# Patient Record
Sex: Male | Born: 1995 | Race: Black or African American | Hispanic: No | Marital: Single | Smoking: Current some day smoker
Health system: Southern US, Community
[De-identification: ages and names within clinical notes are randomized; demographics above are authoritative.]

## PROBLEM LIST (undated history)

## (undated) ENCOUNTER — Ambulatory Visit (HOSPITAL_COMMUNITY): Admission: EM | Payer: Medicaid Other | Source: Home / Self Care

---
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
## 2018-10-22 ENCOUNTER — Ambulatory Visit (HOSPITAL_COMMUNITY)
Admission: EM | Admit: 2018-10-22 | Discharge: 2018-10-22 | Disposition: A | Discharge status: Home / Self Care | Payer: Medicaid Other | Attending: Family Medicine | Admitting: Family Medicine

## 2018-10-22 ENCOUNTER — Other Ambulatory Visit: Payer: Self-pay

## 2018-10-22 ENCOUNTER — Encounter (HOSPITAL_COMMUNITY): Payer: Self-pay | Admitting: *Deleted

## 2018-10-22 DIAGNOSIS — R369 Urethral discharge, unspecified: Secondary | ICD-10-CM | POA: Diagnosis not present

## 2018-10-22 DIAGNOSIS — R8281 Pyuria: Secondary | ICD-10-CM | POA: Diagnosis present

## 2018-10-22 DIAGNOSIS — Z113 Encounter for screening for infections with a predominantly sexual mode of transmission: Secondary | ICD-10-CM

## 2018-10-22 DIAGNOSIS — Z202 Contact with and (suspected) exposure to infections with a predominantly sexual mode of transmission: Secondary | ICD-10-CM | POA: Diagnosis not present

## 2018-10-22 DIAGNOSIS — R300 Dysuria: Secondary | ICD-10-CM

## 2018-10-22 DIAGNOSIS — F17210 Nicotine dependence, cigarettes, uncomplicated: Secondary | ICD-10-CM

## 2018-10-22 DIAGNOSIS — N342 Other urethritis: Secondary | ICD-10-CM | POA: Diagnosis present

## 2018-10-22 LAB — POCT I-STAT, CHEM 8
BUN: 10 mg/dL (ref 6–20)
CALCIUM ION: 1.16 mmol/L (ref 1.15–1.40)
Chloride: 103 mmol/L (ref 98–111)
Creatinine, Ser: 1 mg/dL (ref 0.61–1.24)
Glucose, Bld: 91 mg/dL (ref 70–99)
HCT: 50 % (ref 39.0–52.0)
Hemoglobin: 17 g/dL (ref 13.0–17.0)
Potassium: 4 mmol/L (ref 3.5–5.1)
Sodium: 139 mmol/L (ref 135–145)
TCO2: 29 mmol/L (ref 22–32)

## 2018-10-22 LAB — CBC WITH DIFFERENTIAL/PLATELET
Abs Immature Granulocytes: 0.02 10*3/uL (ref 0.00–0.07)
Basophils Absolute: 0 10*3/uL (ref 0.0–0.1)
Basophils Relative: 0 %
Eosinophils Absolute: 0 10*3/uL (ref 0.0–0.5)
Eosinophils Relative: 0 %
HCT: 47 % (ref 39.0–52.0)
Hemoglobin: 15.6 g/dL (ref 13.0–17.0)
Immature Granulocytes: 0 %
Lymphocytes Relative: 30 %
Lymphs Abs: 2.4 10*3/uL (ref 0.7–4.0)
MCH: 30.2 pg (ref 26.0–34.0)
MCHC: 33.2 g/dL (ref 30.0–36.0)
MCV: 90.9 fL (ref 80.0–100.0)
Monocytes Absolute: 0.6 10*3/uL (ref 0.1–1.0)
Monocytes Relative: 7 %
Neutro Abs: 4.9 10*3/uL (ref 1.7–7.7)
Neutrophils Relative %: 63 %
Platelets: 202 10*3/uL (ref 150–400)
RBC: 5.17 MIL/uL (ref 4.22–5.81)
RDW: 11.5 % (ref 11.5–15.5)
WBC: 8 10*3/uL (ref 4.0–10.5)
nRBC: 0 % (ref 0.0–0.2)

## 2018-10-22 LAB — RAPID HIV SCREEN (HIV 1/2 AB+AG)
HIV 1/2 Antibodies: NONREACTIVE
HIV-1 P24 Antigen - HIV24: NONREACTIVE

## 2018-10-22 LAB — POCT URINALYSIS DIP (DEVICE)
Bilirubin Urine: NEGATIVE
Glucose, UA: NEGATIVE mg/dL
HGB URINE DIPSTICK: NEGATIVE
Ketones, ur: NEGATIVE mg/dL
Nitrite: NEGATIVE
PH: 7 (ref 5.0–8.0)
Protein, ur: NEGATIVE mg/dL
Specific Gravity, Urine: 1.02 (ref 1.005–1.030)
Urobilinogen, UA: 1 mg/dL (ref 0.0–1.0)

## 2018-10-22 MED ORDER — AZITHROMYCIN 250 MG PO TABS
ORAL_TABLET | ORAL | Status: AC
  Filled 2018-10-22: qty 4

## 2018-10-22 MED ORDER — AZITHROMYCIN 250 MG PO TABS
1000.0000 mg | ORAL_TABLET | Freq: Once | ORAL | Status: AC
  Administered 2018-10-22: 1000 mg via ORAL

## 2018-10-22 MED ORDER — CIPROFLOXACIN HCL 500 MG PO TABS: 500.0000 mg | ORAL_TABLET | Freq: Two times a day (BID) | ORAL | 0 refills | Status: DC

## 2018-10-22 NOTE — ED Provider Notes (Signed)
[facility]    CSN: [id number] Arrival date & time: 10/22/18  1248     History   Chief Complaint Chief Complaint  Patient presents with  . Exposure to STD    HPI Camran Raman is a 23 y.o. male.   This is the initial visit for this 23 year old man seeking evaluation for STD.  He has had a clear discharge and slight burning for 3 days.  He has a new partner.  Patient denies any skin lesions, fever, joint pain.     History reviewed. No pertinent past medical history.  There are no active problems to display for this patient.   History reviewed. No pertinent surgical history.     Home Medications    Prior to Admission medications   Medication Sig Start Date End Date Taking? Authorizing Provider  cyclopentolate (CYCLODRYL,CYCLOGYL) 1 % ophthalmic solution Place 1 drop into the right eye 3 (three) times daily. 06/06/14   Elwin Mocha, MD  ibuprofen (ADVIL,MOTRIN) 600 MG tablet Take 1 tablet (600 mg total) by mouth every 6 (six) hours as needed. 06/06/14   Elwin Mocha, MD  prednisoLONE acetate (PRED FORTE) 1 % ophthalmic suspension Place 1 drop into the right eye 4 (four) times daily. 06/06/14   Elwin Mocha, MD    Family History No family history on file.  Social History Social History   Tobacco Use  . Smoking status: Current Every Day Smoker  . Smokeless tobacco: Never Used  Substance Use Topics  . Alcohol use: Yes  . Drug use: Not Currently     Allergies   Patient has no known allergies.   Review of Systems Review of Systems   Physical Exam Triage Vital Signs ED Triage Vitals [10/22/18 1332]  Enc Vitals Group     BP 123/80     Pulse Rate (!) 52     Resp 18     Temp 97.6 F (36.4 C)     Temp Source Oral     SpO2 99 %     Weight      Height      Head Circumference      Peak Flow      Pain Score      Pain Loc      Pain Edu?      Excl. in GC?    No data found.  Updated Vital Signs BP 123/80 (BP Location: Right Arm)    Pulse (!) 52   Temp 97.6 F (36.4 C) (Oral)   Resp 18   SpO2 99%   Physical Exam Vitals signs and nursing note reviewed.  Constitutional:      Appearance: Normal appearance.  HENT:     Head: Normocephalic.     Mouth/Throat:     Mouth: Mucous membranes are moist.  Eyes:     Conjunctiva/sclera: Conjunctivae normal.  Pulmonary:     Effort: Pulmonary effort is normal.  Skin:    General: Skin is warm and dry.  Neurological:     General: No focal deficit present.     Mental Status: He is alert and oriented to person, place, and time.  Psychiatric:        Mood and Affect: Mood normal.        Behavior: Behavior normal.        Thought Content: Thought content normal.        Judgment: Judgment normal.      UC Treatments / Results  Labs (all labs ordered are listed,  but only abnormal results are displayed) Labs Reviewed  URINE CYTOLOGY ANCILLARY ONLY    EKG None  Radiology No results found.  Procedures Procedures (including critical care time)  Medications Ordered in UC Medications  azithromycin (ZITHROMAX) tablet 1,000 mg (has no administration in time range)    Initial Impression / Assessment and Plan / UC Course  I have reviewed the triage vital signs and the nursing notes.  Pertinent labs & imaging results that were available during my care of the patient were reviewed by me and considered in my medical decision making (see chart for details).    Final Clinical Impressions(s) / UC Diagnoses   Final diagnoses:  Exposure to STD     Discharge Instructions     Use condoms and you will have to be is likely to need our assistance.    ED Prescriptions    None     Controlled Substance Prescriptions [place] Controlled Substance Registry consulted? Not Applicable   Elvina Sidle, MD 10/22/18 1402

## 2018-10-22 NOTE — Discharge Instructions (Addendum)
Use condoms and you will have to be is likely to need our assistance.

## 2018-10-22 NOTE — ED Triage Notes (Signed)
C/o burning with urination. C/o clear penile discharge.

## 2018-10-23 LAB — URINE CULTURE: Culture: NO GROWTH

## 2018-10-23 LAB — RPR: RPR Ser Ql: NONREACTIVE

## 2018-10-24 ENCOUNTER — Telehealth (HOSPITAL_COMMUNITY): Payer: Self-pay | Admitting: Emergency Medicine

## 2018-10-24 LAB — URINE CYTOLOGY ANCILLARY ONLY
Chlamydia: NEGATIVE
Neisseria Gonorrhea: NEGATIVE
Trichomonas: NEGATIVE

## 2018-10-24 NOTE — Telephone Encounter (Signed)
Negative results attempted to call and inform pt no answer and no voice mail set up

## 2019-04-03 ENCOUNTER — Ambulatory Visit (HOSPITAL_COMMUNITY)
Admission: EM | Admit: 2019-04-03 | Discharge: 2019-04-03 | Disposition: A | Discharge status: Home / Self Care | Payer: Medicaid Other | Attending: Family Medicine | Admitting: Family Medicine

## 2019-04-03 ENCOUNTER — Encounter (HOSPITAL_COMMUNITY): Payer: Self-pay | Admitting: *Deleted

## 2019-04-03 ENCOUNTER — Other Ambulatory Visit: Payer: Self-pay

## 2019-04-03 DIAGNOSIS — T7840XA Allergy, unspecified, initial encounter: Secondary | ICD-10-CM | POA: Diagnosis present

## 2019-04-03 LAB — POCT RAPID STREP A: Streptococcus, Group A Screen (Direct): NEGATIVE

## 2019-04-03 MED ORDER — CETIRIZINE HCL 10 MG PO TABS: 10.0000 mg | ORAL_TABLET | Freq: Every day | ORAL | 0 refills | Status: DC

## 2019-04-03 NOTE — ED Triage Notes (Signed)
C/O throat "sounding different" over past couple days.  Denies any pain, fevers, or any other sxs.

## 2019-04-03 NOTE — ED Provider Notes (Signed)
[facility]    CSN: [id number] Arrival date & time: 04/03/19  1357     History   Chief Complaint Chief Complaint  Patient presents with  . Sore Throat    HPI Malik Sanders is a 23 y.o. male.   Patient is a 23 year old male the presents today with laryngitis.  Reporting that his voice has sounded different over the last couple days.  Denies any pain associated with this.  Denies any trouble swallowing or fevers.  History of seasonal allergies.  He has not taken any medication for his symptoms.  Denies any cough, chest congestion, ear pain, rhinorrhea.  ROS per HPI      History reviewed. No pertinent past medical history.  There are no active problems to display for this patient.   History reviewed. No pertinent surgical history.     Home Medications    Prior to Admission medications   Medication Sig Start Date End Date Taking? Authorizing Provider  cetirizine (ZYRTEC) 10 MG tablet Take 1 tablet (10 mg total) by mouth daily. 04/03/19   Dahlia ByesBast, Zebulun Deman A, NP  cyclopentolate (CYCLODRYL,CYCLOGYL) 1 % ophthalmic solution Place 1 drop into the right eye 3 (three) times daily. 06/06/14   Elwin MochaWalden, Blair, MD  ibuprofen (ADVIL,MOTRIN) 600 MG tablet Take 1 tablet (600 mg total) by mouth every 6 (six) hours as needed. 06/06/14   Elwin MochaWalden, Blair, MD  prednisoLONE acetate (PRED FORTE) 1 % ophthalmic suspension Place 1 drop into the right eye 4 (four) times daily. 06/06/14   Elwin MochaWalden, Blair, MD    Family History Family History  Problem Relation Age of Onset  . Healthy Mother   . Healthy Father     Social History Social History   Tobacco Use  . Smoking status: Current Some Day Smoker    Types: Cigars  . Smokeless tobacco: Never Used  Substance Use Topics  . Alcohol use: Yes    Comment: rarely  . Drug use: Yes    Types: Marijuana     Allergies   Patient has no known allergies.   Review of Systems Review of Systems   Physical Exam Triage Vital Signs ED Triage  Vitals  Enc Vitals Group     BP 04/03/19 1425 128/76     Pulse Rate 04/03/19 1425 72     Resp 04/03/19 1425 18     Temp 04/03/19 1425 98.8 F (37.1 C)     Temp Source 04/03/19 1425 Oral     SpO2 04/03/19 1425 100 %     Weight --      Height --      Head Circumference --      Peak Flow --      Pain Score 04/03/19 1426 0     Pain Loc --      Pain Edu? --      Excl. in GC? --    No data found.  Updated Vital Signs BP 128/76   Pulse 72   Temp 98.8 F (37.1 C) (Oral)   Resp 18   SpO2 100%   Visual Acuity Right Eye Distance:   Left Eye Distance:   Bilateral Distance:    Right Eye Near:   Left Eye Near:    Bilateral Near:     Physical Exam Constitutional:      General: He is not in acute distress.    Appearance: He is well-developed. He is not ill-appearing, toxic-appearing or diaphoretic.  HENT:     Head: Normocephalic and  atraumatic.     Right Ear: Tympanic membrane and ear canal normal.     Left Ear: Tympanic membrane and ear canal normal.     Mouth/Throat:     Mouth: Mucous membranes are moist.     Pharynx: Uvula midline.     Tonsils: 1+ on the right. 1+ on the left.     Comments: PND  Eyes:     Conjunctiva/sclera: Conjunctivae normal.  Neck:     Musculoskeletal: Normal range of motion.  Pulmonary:     Effort: Pulmonary effort is normal.     Breath sounds: Normal breath sounds.  Lymphadenopathy:     Cervical: No cervical adenopathy.  Skin:    General: Skin is warm and dry.  Neurological:     Mental Status: He is alert.  Psychiatric:        Mood and Affect: Mood normal.      UC Treatments / Results  Labs (all labs ordered are listed, but only abnormal results are displayed) Labs Reviewed  CULTURE, GROUP A STREP [facility])  POCT RAPID STREP A    EKG None  Radiology No results found.  Procedures Procedures (including critical care time)  Medications Ordered in UC Medications - No data to display  Initial Impression / Assessment and Plan  / UC Course  I have reviewed the triage vital signs and the nursing notes.  Pertinent labs & imaging results that were available during my care of the patient were reviewed by me and considered in my medical decision making (see chart for details).     Rapid strep test negative. Symptoms consistent with allergies.  Will treat with Zyrtec daily Follow up as needed for continued or worsening symptoms  Final Clinical Impressions(s) / UC Diagnoses   Final diagnoses:  Allergic state, initial encounter     Discharge Instructions     Your rapid strep test was negative.  We are treating you for allergies Follow up as needed for continued or worsening symptoms     ED Prescriptions    Medication Sig Dispense Auth. Provider   cetirizine (ZYRTEC) 10 MG tablet Take 1 tablet (10 mg total) by mouth daily. 30 tablet Loura Halt A, NP     Controlled Substance Prescriptions [place] Controlled Substance Registry consulted? Not Applicable   Orvan July, NP 04/03/19 1516

## 2019-04-03 NOTE — Discharge Instructions (Addendum)
Your rapid strep test was negative.  We are treating you for allergies Follow up as needed for continued or worsening symptoms

## 2019-04-05 LAB — CULTURE, GROUP A STREP (THRC)

## 2019-07-24 ENCOUNTER — Ambulatory Visit (HOSPITAL_COMMUNITY)
Admission: EM | Admit: 2019-07-24 | Discharge: 2019-07-24 | Disposition: A | Discharge status: Home / Self Care | Payer: Medicaid Other | Attending: Emergency Medicine | Admitting: Emergency Medicine

## 2019-07-24 ENCOUNTER — Other Ambulatory Visit: Payer: Self-pay

## 2019-07-24 DIAGNOSIS — Z20828 Contact with and (suspected) exposure to other viral communicable diseases: Secondary | ICD-10-CM | POA: Diagnosis not present

## 2019-07-24 DIAGNOSIS — J029 Acute pharyngitis, unspecified: Secondary | ICD-10-CM | POA: Diagnosis present

## 2019-07-24 MED ORDER — CETIRIZINE HCL 10 MG PO CAPS: 10.0000 mg | ORAL_CAPSULE | Freq: Every day | ORAL | 0 refills | Status: DC

## 2019-07-24 MED ORDER — IBUPROFEN 600 MG PO TABS: 600.0000 mg | ORAL_TABLET | Freq: Four times a day (QID) | ORAL | 0 refills | Status: DC | PRN

## 2019-07-24 MED ORDER — FLUTICASONE PROPIONATE 50 MCG/ACT NA SUSP: 1.0000 | Freq: Every day | NASAL | 0 refills | Status: DC

## 2019-07-24 NOTE — ED Provider Notes (Signed)
[facility]    CSN: [id number] Arrival date & time: 07/24/19  1253      History   Chief Complaint Chief Complaint  Patient presents with  . Sore Throat    HPI Malik Sanders is a 23 y.o. male no significant past medical history presenting today for evaluation of sore throat.  Patient states he has had a sore throat off and on over the past month.  His state he has had some mild congestion and very infrequent coughing.  Energy level has been normal, denies fevers chills or body aches.  Denies close contacts with similar symptoms.  Denies known exposure to COVID.  Denies known exposure to strep or mono.  Notes that he does have history of tonsil stones and will occasionally have a stone.  He has not taken any medicine for symptoms.  HPI  No past medical history on file.  There are no active problems to display for this patient.   No past surgical history on file.     Home Medications    Prior to Admission medications   Medication Sig Start Date End Date Taking? Authorizing Provider  Cetirizine HCl 10 MG CAPS Take 1 capsule (10 mg total) by mouth daily for 14 days. 07/24/19 08/07/19  ,  C, PA-C  fluticasone (FLONASE) 50 MCG/ACT nasal spray Place 1-2 sprays into both nostrils daily for 7 days. 07/24/19 07/31/19  ,  C, PA-C  ibuprofen (ADVIL) 600 MG tablet Take 1 tablet (600 mg total) by mouth every 6 (six) hours as needed. 07/24/19   , Junius Creamer, PA-C    Family History Family History  Problem Relation Age of Onset  . Healthy Mother   . Healthy Father     Social History Social History   Tobacco Use  . Smoking status: Current Some Day Smoker    Types: Cigars  . Smokeless tobacco: Never Used  Substance Use Topics  . Alcohol use: Yes    Comment: rarely  . Drug use: Yes    Types: Marijuana     Allergies   Patient has no known allergies.   Review of Systems Review of Systems  Constitutional: Negative for activity  change, appetite change, chills, fatigue and fever.  HENT: Positive for sore throat. Negative for congestion, ear pain, rhinorrhea, sinus pressure and trouble swallowing.   Eyes: Negative for discharge and redness.  Respiratory: Negative for cough, chest tightness and shortness of breath.   Cardiovascular: Negative for chest pain.  Gastrointestinal: Negative for abdominal pain, diarrhea, nausea and vomiting.  Musculoskeletal: Negative for myalgias.  Skin: Negative for rash.  Neurological: Negative for dizziness, light-headedness and headaches.     Physical Exam Triage Vital Signs ED Triage Vitals  Enc Vitals Group     BP 07/24/19 1331 106/75     Pulse Rate 07/24/19 1331 (!) 53     Resp 07/24/19 1331 16     Temp 07/24/19 1331 98.7 F (37.1 C)     Temp Source 07/24/19 1331 Oral     SpO2 07/24/19 1331 100 %     Weight --      Height --      Head Circumference --      Peak Flow --      Pain Score 07/24/19 1334 0     Pain Loc --      Pain Edu? --      Excl. in GC? --    No data found.  Updated Vital Signs BP 106/75 (  BP Location: Right Arm)   Pulse (!) 53   Temp 98.7 F (37.1 C) (Oral)   Resp 16   SpO2 100%   Visual Acuity Right Eye Distance:   Left Eye Distance:   Bilateral Distance:    Right Eye Near:   Left Eye Near:    Bilateral Near:     Physical Exam Vitals signs and nursing note reviewed.  Constitutional:      Appearance: He is well-developed.  HENT:     Head: Normocephalic and atraumatic.     Ears:     Comments: Bilateral ears without tenderness to palpation of external auricle, tragus and mastoid, EAC's without erythema or swelling, TM's with good bony landmarks and cone of light. Non erythematous.     Nose:     Comments: Nasal mucosa erythematous, rhinorrhea present bilaterally, swollen turbinates on right side    Mouth/Throat:     Comments: Oral mucosa pink and moist, no tonsillar enlargement or exudate. Posterior pharynx patent and nonerythematous,  no uvula deviation or swelling. Normal phonation.  Eyes:     Conjunctiva/sclera: Conjunctivae normal.  Neck:     Musculoskeletal: Neck supple.     Comments: Lymphadenopathy present, no overlying swelling or erythema, full active range of motion of neck Cardiovascular:     Rate and Rhythm: Normal rate and regular rhythm.     Heart sounds: No murmur.  Pulmonary:     Effort: Pulmonary effort is normal. No respiratory distress.     Breath sounds: Normal breath sounds.     Comments: Breathing comfortably at rest, CTABL, no wheezing, rales or other adventitious sounds auscultated Abdominal:     Palpations: Abdomen is soft.     Tenderness: There is no abdominal tenderness.  Skin:    General: Skin is warm and dry.  Neurological:     Mental Status: He is alert.      UC Treatments / Results  Labs (all labs ordered are listed, but only abnormal results are displayed) Labs Reviewed  NOVEL CORONAVIRUS, NAA (HOSP ORDER, SEND-OUT TO REF LAB; TAT 18-24 HRS)    EKG   Radiology No results found.  Procedures Procedures (including critical care time)  Medications Ordered in UC Medications - No data to display  Initial Impression / Assessment and Plan / UC Course  I have reviewed the triage vital signs and the nursing notes.  Pertinent labs & imaging results that were available during my care of the patient were reviewed by me and considered in my medical decision making (see chart for details).    COVID pending. Exam normal, given timing of symptoms feel strep less likely.  Most likely allergic or related to drainage.  Recommend initiating Zyrtec and Flonase.  Also continue further symptomatic and supportive care.Discussed strict return precautions. Patient verbalized understanding and is agreeable with plan.  Final Clinical Impressions(s) / UC Diagnoses   Final diagnoses:  Sore throat     Discharge Instructions     Sore Throat  Begin daily cetirizine, flonase nasal spray  1-2 spray daily  Use anti-inflammatories for pain/swelling. You may take up to 800 mg Ibuprofen every 8 hours with food. You may supplement Ibuprofen with Tylenol [phone] mg every 8 hours.   Please continue Tylenol or Ibuprofen for fever and pain. May try salt water gargles, cepacol lozenges, throat spray, or OTC cold relief medicine for throat discomfort. If you also have congestion take a daily anti-histamine like Zyrtec, Claritin, and a oral decongestant to help with post  nasal drip that may be irritating your throat.   Stay hydrated and drink plenty of fluids to keep your throat coated relieve irritation.    ED Prescriptions    Medication Sig Dispense Auth. Provider   fluticasone (FLONASE) 50 MCG/ACT nasal spray Place 1-2 sprays into both nostrils daily for 7 days. 1 g ,  C, PA-C   Cetirizine HCl 10 MG CAPS Take 1 capsule (10 mg total) by mouth daily for 14 days. 20 capsule ,  C, PA-C   ibuprofen (ADVIL) 600 MG tablet Take 1 tablet (600 mg total) by mouth every 6 (six) hours as needed. 30 tablet , [place] C, PA-C     PDMP not reviewed this encounter.   Joneen Caraway [place] C, PA-C 07/24/19 1407

## 2019-07-24 NOTE — Discharge Instructions (Signed)
Sore Throat  Begin daily cetirizine, flonase nasal spray 1-2 spray daily  Use anti-inflammatories for pain/swelling. You may take up to 800 mg Ibuprofen every 8 hours with food. You may supplement Ibuprofen with Tylenol [phone] mg every 8 hours.   Please continue Tylenol or Ibuprofen for fever and pain. May try salt water gargles, cepacol lozenges, throat spray, or OTC cold relief medicine for throat discomfort. If you also have congestion take a daily anti-histamine like Zyrtec, Claritin, and a oral decongestant to help with post nasal drip that may be irritating your throat.   Stay hydrated and drink plenty of fluids to keep your throat coated relieve irritation.

## 2019-07-24 NOTE — ED Triage Notes (Signed)
Pt presents to UC w/ c/o sore throat on and off for 1 month. Pt reports occasionally coughing up tonsil stones. He was negative for strep one month ago.

## 2019-07-27 LAB — NOVEL CORONAVIRUS, NAA (HOSP ORDER, SEND-OUT TO REF LAB; TAT 18-24 HRS): SARS-CoV-2, NAA: NOT DETECTED

## 2021-02-11 ENCOUNTER — Encounter (HOSPITAL_COMMUNITY): Payer: Self-pay

## 2021-02-11 ENCOUNTER — Ambulatory Visit (INDEPENDENT_AMBULATORY_CARE_PROVIDER_SITE_OTHER): Payer: Medicaid Other

## 2021-02-11 ENCOUNTER — Ambulatory Visit (HOSPITAL_COMMUNITY)
Admission: EM | Admit: 2021-02-11 | Discharge: 2021-02-11 | Disposition: A | Discharge status: Home / Self Care | Payer: Medicaid Other | Attending: Physician Assistant | Admitting: Physician Assistant

## 2021-02-11 ENCOUNTER — Other Ambulatory Visit: Payer: Self-pay

## 2021-02-11 DIAGNOSIS — M546 Pain in thoracic spine: Secondary | ICD-10-CM

## 2021-02-11 MED ORDER — METHOCARBAMOL 500 MG PO TABS: 500.0000 mg | ORAL_TABLET | Freq: Two times a day (BID) | ORAL | 0 refills | Status: DC

## 2021-02-11 MED ORDER — NAPROXEN 500 MG PO TABS: 500.0000 mg | ORAL_TABLET | Freq: Two times a day (BID) | ORAL | 0 refills | Status: DC
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
## 2021-07-15 ENCOUNTER — Ambulatory Visit (HOSPITAL_COMMUNITY): Payer: Self-pay

## 2021-11-03 ENCOUNTER — Encounter (HOSPITAL_BASED_OUTPATIENT_CLINIC_OR_DEPARTMENT_OTHER): Payer: Self-pay

## 2021-11-03 ENCOUNTER — Emergency Department (HOSPITAL_BASED_OUTPATIENT_CLINIC_OR_DEPARTMENT_OTHER)
Admission: EM | Admit: 2021-11-03 | Discharge: 2021-11-03 | Disposition: A | Discharge status: Home / Self Care | Payer: No Typology Code available for payment source | Attending: Emergency Medicine | Admitting: Emergency Medicine

## 2021-11-03 ENCOUNTER — Other Ambulatory Visit: Payer: Self-pay

## 2021-11-03 DIAGNOSIS — R0789 Other chest pain: Secondary | ICD-10-CM | POA: Insufficient documentation

## 2021-11-03 DIAGNOSIS — Y92410 Unspecified street and highway as the place of occurrence of the external cause: Secondary | ICD-10-CM | POA: Insufficient documentation

## 2021-11-03 MED ORDER — LIDOCAINE 5 % EX PTCH
1.0000 | MEDICATED_PATCH | CUTANEOUS | Status: DC
  Administered 2021-11-03: 1 via TRANSDERMAL
  Filled 2021-11-03: qty 1

## 2021-11-03 MED ORDER — LIDOCAINE 5 % EX PTCH: 1.0000 | MEDICATED_PATCH | CUTANEOUS | 0 refills | Status: DC
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
## 2021-11-03 NOTE — ED Notes (Signed)
No Response when called for Triage.

## 2021-11-03 NOTE — ED Notes (Signed)
Pt verbalizes understanding of discharge instructions. Opportunity for questioning and answers were provided. Pt discharged from ED to home.   ? ?

## 2022-05-28 ENCOUNTER — Encounter (HOSPITAL_COMMUNITY): Payer: Self-pay

## 2022-05-28 ENCOUNTER — Ambulatory Visit (HOSPITAL_COMMUNITY)
Admission: EM | Admit: 2022-05-28 | Discharge: 2022-05-28 | Disposition: A | Discharge status: Home / Self Care | Payer: Medicaid Other | Attending: Family Medicine | Admitting: Family Medicine

## 2022-05-28 DIAGNOSIS — Z202 Contact with and (suspected) exposure to infections with a predominantly sexual mode of transmission: Secondary | ICD-10-CM | POA: Diagnosis not present

## 2022-05-28 DIAGNOSIS — R369 Urethral discharge, unspecified: Secondary | ICD-10-CM

## 2022-05-28 LAB — POCT URINALYSIS DIPSTICK, ED / UC
Bilirubin Urine: NEGATIVE
Glucose, UA: NEGATIVE mg/dL
Hgb urine dipstick: NEGATIVE
Ketones, ur: NEGATIVE mg/dL
Nitrite: NEGATIVE
Protein, ur: NEGATIVE mg/dL
Specific Gravity, Urine: 1.02 (ref 1.005–1.030)
Urobilinogen, UA: 0.2 mg/dL (ref 0.0–1.0)
pH: 5.5 (ref 5.0–8.0)

## 2022-05-28 LAB — HIV ANTIBODY (ROUTINE TESTING W REFLEX): HIV Screen 4th Generation wRfx: NONREACTIVE

## 2022-05-28 MED ORDER — CEFTRIAXONE SODIUM 500 MG IJ SOLR
500.0000 mg | Freq: Once | INTRAMUSCULAR | Status: AC
  Administered 2022-05-28: 500 mg via INTRAMUSCULAR

## 2022-05-28 MED ORDER — LIDOCAINE HCL (PF) 1 % IJ SOLN
INTRAMUSCULAR | Status: AC
  Filled 2022-05-28: qty 2

## 2022-05-28 MED ORDER — CEFTRIAXONE SODIUM 500 MG IJ SOLR
INTRAMUSCULAR | Status: AC
  Filled 2022-05-28: qty 500

## 2022-05-28 MED ORDER — DOXYCYCLINE HYCLATE 100 MG PO CAPS: 100.0000 mg | ORAL_CAPSULE | Freq: Two times a day (BID) | ORAL | 0 refills | Status: DC

## 2022-05-28 NOTE — Discharge Instructions (Addendum)
You were seen today and treated for possible STD.  You received a shot today to treat possible gonorrhea.  I have sent an antibiotic to your pharmacy to treat chlamydia.   You swab and lab work will be resulted tomorrow.  You will be able to see these results in mychart.  You may call if you have any questions.  We will be in contact regardless with any positive results.
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
## 2022-05-28 NOTE — ED Provider Notes (Signed)
[facility]    CSN: [id number] Arrival date & time: 05/28/22  1307      History   Chief Complaint Chief Complaint  Patient presents with   Exposure to STD    HPI Malik Sanders is a 26 y.o. male.   Patient is here for penile d/c. He had unprotected intercourse, and having symptoms x 2 days.  + dysuria.  No abd pain or flank pain.  No fevers/chills.  He would also like blood work testing as well.   History reviewed. No pertinent past medical history.  There are no problems to display for this patient.   History reviewed. No pertinent surgical history.     Home Medications    Prior to Admission medications   Medication Sig Start Date End Date Taking? Authorizing Provider  Cetirizine HCl 10 MG CAPS Take 1 capsule (10 mg total) by mouth daily for 14 days. 07/24/19 08/07/19  Wieters, Hallie C, PA-C  fluticasone (FLONASE) 50 MCG/ACT nasal spray Place 1-2 sprays into both nostrils daily for 7 days. 07/24/19 07/31/19  Wieters, Hallie C, PA-C  ibuprofen (ADVIL) 600 MG tablet Take 1 tablet (600 mg total) by mouth every 6 (six) hours as needed. 07/24/19   Wieters, Hallie C, PA-C  lidocaine (LIDODERM) 5 % Place 1 patch onto the skin daily. Remove & Discard patch within 12 hours or as directed by MD 11/03/21   Lorre Nick, MD  methocarbamol (ROBAXIN) 500 MG tablet Take 1 tablet (500 mg total) by mouth 2 (two) times daily. 02/11/21   Raspet, Noberto Retort, PA-C  naproxen (NAPROSYN) 500 MG tablet Take 1 tablet (500 mg total) by mouth 2 (two) times daily. 02/11/21   Raspet, Noberto Retort, PA-C    Family History Family History  Problem Relation Age of Onset   Healthy Mother    Healthy Father     Social History Social History   Tobacco Use   Smoking status: Some Days    Types: Cigars   Smokeless tobacco: Never  Vaping Use   Vaping Use: Never used  Substance Use Topics   Alcohol use: Yes    Comment: rarely   Drug use: Yes    Types: Marijuana     Allergies   Patient has no  known allergies.   Review of Systems Review of Systems  Constitutional: Negative.   HENT: Negative.    Respiratory: Negative.    Cardiovascular: Negative.   Gastrointestinal: Negative.   Genitourinary:  Positive for penile discharge.  Musculoskeletal: Negative.      Physical Exam Triage Vital Signs ED Triage Vitals  Enc Vitals Group     BP 05/28/22 1319 (!) 104/59     Pulse Rate 05/28/22 1319 66     Resp 05/28/22 1319 16     Temp 05/28/22 1319 98.1 F (36.7 C)     Temp Source 05/28/22 1319 Oral     SpO2 05/28/22 1319 99 %     Weight --      Height --      Head Circumference --      Peak Flow --      Pain Score 05/28/22 1321 0     Pain Loc --      Pain Edu? --      Excl. in [place]? --    No data found.  Updated Vital Signs BP (!) 104/59 (BP Location: Right Arm)   Pulse 66   Temp 98.1 F (36.7 C) (Oral)   Resp 16  SpO2 99%   Visual Acuity Right Eye Distance:   Left Eye Distance:   Bilateral Distance:    Right Eye Near:   Left Eye Near:    Bilateral Near:     Physical Exam HENT:     Head: Normocephalic.  Cardiovascular:     Rate and Rhythm: Normal rate and regular rhythm.  Pulmonary:     Effort: Pulmonary effort is normal.     Breath sounds: Normal breath sounds.  Musculoskeletal:     Cervical back: Normal range of motion.  Skin:    General: Skin is warm.  Neurological:     General: No focal deficit present.     Mental Status: He is alert.  Psychiatric:        Mood and Affect: Mood normal.      UC Treatments / Results  Labs (all labs ordered are listed, but only abnormal results are displayed) Labs Reviewed  POCT URINALYSIS DIPSTICK, ED / UC - Abnormal; Notable for the following components:      Result Value   Leukocytes,Ua SMALL (*)    All other components within normal limits  HIV ANTIBODY (ROUTINE TESTING W REFLEX)  RPR  CYTOLOGY, (ORAL, ANAL, URETHRAL) ANCILLARY ONLY    EKG   Radiology No results  found.  Procedures Procedures (including critical care time)  Medications Ordered in UC Medications - No data to display  Initial Impression / Assessment and Plan / UC Course  I have reviewed the triage vital signs and the nursing notes.  Pertinent labs & imaging results that were available during my care of the patient were reviewed by me and considered in my medical decision making (see chart for details).    Final Clinical Impressions(s) / UC Diagnoses   Final diagnoses:  Penile discharge  Possible exposure to STD     Discharge Instructions      You were seen today and treated for possible STD.  You received a shot today to treat possible gonorrhea.  I have sent an antibiotic to your pharmacy to treat chlamydia.   You swab and lab work will be resulted tomorrow.  You will be able to see these results in mychart.  You may call if you have any questions.  We will be in contact regardless with any positive results.     ED Prescriptions     Medication Sig Dispense Auth. Provider   doxycycline (VIBRAMYCIN) 100 MG capsule Take 1 capsule (100 mg total) by mouth 2 (two) times daily. 20 capsule Jannifer Franklin, MD      PDMP not reviewed this encounter.   Jannifer Franklin, MD 05/28/22 1400

## 2022-05-29 LAB — RPR: RPR Ser Ql: NONREACTIVE

## 2022-06-01 ENCOUNTER — Telehealth (HOSPITAL_COMMUNITY): Payer: Self-pay | Admitting: Emergency Medicine

## 2022-06-01 LAB — CYTOLOGY, (ORAL, ANAL, URETHRAL) ANCILLARY ONLY
Chlamydia: NEGATIVE
Comment: NEGATIVE
Comment: NEGATIVE
Comment: NORMAL
Neisseria Gonorrhea: NEGATIVE
Trichomonas: POSITIVE — AB

## 2022-06-01 MED ORDER — METRONIDAZOLE 500 MG PO TABS: 2000.0000 mg | ORAL_TABLET | Freq: Once | ORAL | 0 refills | Status: AC

## 2022-06-01 MED ORDER — METRONIDAZOLE 500 MG PO TABS: 2000.0000 mg | ORAL_TABLET | Freq: Once | ORAL | 0 refills | Status: DC

## 2022-11-27 ENCOUNTER — Encounter (HOSPITAL_COMMUNITY): Payer: Self-pay | Admitting: *Deleted

## 2022-11-27 ENCOUNTER — Ambulatory Visit (INDEPENDENT_AMBULATORY_CARE_PROVIDER_SITE_OTHER): Payer: Medicaid Other

## 2022-11-27 ENCOUNTER — Ambulatory Visit (HOSPITAL_COMMUNITY)
Admission: EM | Admit: 2022-11-27 | Discharge: 2022-11-27 | Disposition: A | Discharge status: Home / Self Care | Payer: Medicaid Other | Attending: Family Medicine | Admitting: Family Medicine

## 2022-11-27 DIAGNOSIS — M25531 Pain in right wrist: Secondary | ICD-10-CM

## 2022-11-27 DIAGNOSIS — M79641 Pain in right hand: Secondary | ICD-10-CM

## 2022-11-27 NOTE — ED Triage Notes (Signed)
Pt states he was trying to break up his dogs fighting the last week in January. He states that he has been having right hand and wrist pain it did turn blue and swell initially but that has resolved. 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

## 2022-11-27 NOTE — Discharge Instructions (Addendum)
You were seen today for hand and wrist pain.  Your xrays are all normal today.  I recommend you wear your wrist brace, and use motrin for pain and swelling.  You may wish to follow up with a hand specialist.  You may call [facility] at [phone] to make an appointment.

## 2022-11-27 NOTE — ED Provider Notes (Signed)
[facility]    CSN: [id number] Arrival date & time: 11/27/22  1144      History   Chief Complaint Chief Complaint  Patient presents with   Wrist Pain    HPI [name] is a 27 y.o. male.   Patient is here for right hand/wrist pain.  Several weeks ago he was breaking up his dogs from fighting.   He had pain immediately after, at the wrist and hand.  The hand was swollen as we result.   The swelling has improved, but still with pain.  Painful to put any pressure on the hand, bend back his fingers, open a jar.  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        History reviewed. No pertinent past medical history.  There are no problems to display for this patient.   History reviewed. No pertinent surgical history.     Home Medications    Prior to Admission medications   Medication Sig Start Date End Date Taking? Authorizing Provider  Cetirizine HCl 10 MG CAPS Take 1 capsule (10 mg total) by mouth daily for 14 days. 07/24/19 08/07/19  Wieters, Hallie C, PA-C  doxycycline (VIBRAMYCIN) 100 MG capsule Take 1 capsule (100 mg total) by mouth 2 (two) times daily. 05/28/22   Jahmar Mckelvy, Junie Panning, MD  fluticasone (FLONASE) 50 MCG/ACT nasal spray Place 1-2 sprays into both nostrils daily for 7 days. 07/24/19 07/31/19  Wieters, Hallie C, PA-C  ibuprofen (ADVIL) 600 MG tablet Take 1 tablet (600 mg total) by mouth every 6 (six) hours as needed. 07/24/19   Wieters, Hallie C, PA-C  lidocaine (LIDODERM) 5 % Place 1 patch onto the skin daily. Remove & Discard patch within 12 hours or as directed by MD 11/03/21   Lacretia Leigh, MD  methocarbamol (ROBAXIN) 500 MG tablet Take 1 tablet (500 mg total) by mouth 2 (two) times daily. 02/11/21   Raspet, Derry Skill, PA-C  naproxen (NAPROSYN) 500 MG tablet Take 1 tablet (500 mg total) by mouth 2 (two) times daily. 02/11/21   Raspet, Derry Skill, PA-C    Family History Family History  Problem Relation Age of Onset   Healthy Mother     Healthy Father     Social History Social History   Tobacco Use   Smoking status: Some Days    Types: Cigars   Smokeless tobacco: Never  Vaping Use   Vaping Use: Never used  Substance Use Topics   Alcohol use: Yes    Comment: rarely   Drug use: Yes    Types: Marijuana     Allergies   Patient has no known allergies.   Review of Systems Review of Systems  Constitutional: Negative.   HENT: Negative.    Respiratory: Negative.    Cardiovascular: Negative.   Gastrointestinal: Negative.   Musculoskeletal:  Positive for joint swelling.  Skin: Negative.   Psychiatric/Behavioral: Negative.       Physical Exam Triage Vital Signs ED Triage Vitals  Enc Vitals Group     BP 11/27/22 1214 122/76     Pulse Rate 11/27/22 1214 (!) 48     Resp 11/27/22 1214 18     Temp 11/27/22 1214 97.9 F (36.6 C)     Temp Source 11/27/22 1214 Oral     SpO2 11/27/22 1214 97 %     Weight --      Height --      Head Circumference --  Peak Flow --      Pain Score 11/27/22 1212 7     Pain Loc --      Pain Edu? --      Excl. in [place]? --    No data found.  Updated Vital Signs BP 122/76 (BP Location: Left Arm)   Pulse (!) 48   Temp 97.9 F (36.6 C) (Oral)   Resp 18   SpO2 97%   Visual Acuity Right Eye Distance:   Left Eye Distance:   Bilateral Distance:    Right Eye Near:   Left Eye Near:    Bilateral Near:     Physical Exam Constitutional:      Appearance: Normal appearance.  Musculoskeletal:     Comments: Slight swelling to the right lateral hand;  no wrist TTP;  TTP to the 5th metacarpal, slight ttp to the 4th;  able to flex/extend fingers normally;  full rom of the wrist with minimal hand pain  Skin:    General: Skin is warm.  Neurological:     General: No focal deficit present.     Mental Status: He is alert.  Psychiatric:        Mood and Affect: Mood normal.      UC Treatments / Results  Labs (all labs ordered are listed, but only abnormal results are  displayed) Labs Reviewed - No data to display  EKG   Radiology DG Wrist Complete Right  Result Date: 11/27/2022 CLINICAL DATA:  Right hand and wrist pain. EXAM: RIGHT WRIST - COMPLETE 3+ VIEW; RIGHT HAND - COMPLETE 3+ VIEW COMPARISON:  None Available. FINDINGS: No acute fracture or dislocation. No aggressive osseous lesion. Normal alignment. Soft tissue are unremarkable. No radiopaque foreign body or soft tissue emphysema. IMPRESSION: 1. No acute osseous injury of the right hand and wrist. Electronically Signed   By: Kathreen Devoid M.D.   On: 11/27/2022 12:46   DG Hand Complete Right  Result Date: 11/27/2022 CLINICAL DATA:  Right hand and wrist pain. EXAM: RIGHT WRIST - COMPLETE 3+ VIEW; RIGHT HAND - COMPLETE 3+ VIEW COMPARISON:  None Available. FINDINGS: No acute fracture or dislocation. No aggressive osseous lesion. Normal alignment. Soft tissue are unremarkable. No radiopaque foreign body or soft tissue emphysema. IMPRESSION: 1. No acute osseous injury of the right hand and wrist. Electronically Signed   By: Kathreen Devoid M.D.   On: 11/27/2022 12:46    Procedures Procedures (including critical care time)  Medications Ordered in UC Medications - No data to display  Initial Impression / Assessment and Plan / UC Course  I have reviewed the triage vital signs and the nursing notes.  Pertinent labs & imaging results that were available during my care of the patient were reviewed by me and considered in my medical decision making (see chart for details).   Final Clinical Impressions(s) / UC Diagnoses   Final diagnoses:  Right wrist pain  Right hand pain     Discharge Instructions      You were seen today for hand and wrist pain.  Your xrays are all normal today.  I recommend you wear your wrist brace, and use motrin for pain and swelling.  You may wish to follow up with a hand specialist.  You may call [facility] at [phone] to make an appointment.     ED Prescriptions    None    PDMP not reviewed this encounter.   Rondel Oh, MD 11/27/22 1258

## 2023-08-24 ENCOUNTER — Encounter (HOSPITAL_COMMUNITY): Payer: Self-pay | Admitting: *Deleted

## 2023-08-24 ENCOUNTER — Ambulatory Visit (HOSPITAL_COMMUNITY)
Admission: EM | Admit: 2023-08-24 | Discharge: 2023-08-24 | Disposition: A | Discharge status: Home / Self Care | Payer: Medicaid Other | Attending: Family Medicine | Admitting: Family Medicine

## 2023-08-24 DIAGNOSIS — Z113 Encounter for screening for infections with a predominantly sexual mode of transmission: Secondary | ICD-10-CM

## 2023-08-24 NOTE — Discharge Instructions (Signed)
We have sent testing for sexually transmitted infections. We will notify you of any positive results once they are received. If required, we will prescribe any medications you might need.  Please refrain from all sexual activity for at least the next seven days.

## 2023-08-24 NOTE — ED Provider Notes (Signed)
  [facility]   [id number] 08/24/23 Arrival Time: 1226  ASSESSMENT & PLAN:  1. Screening for STDs (sexually transmitted diseases)      Discharge Instructions      We have sent testing for sexually transmitted infections. We will notify you of any positive results once they are received. If required, we will prescribe any medications you might need.  Please refrain from all sexual activity for at least the next seven days.     Pending: Labs Reviewed  CYTOLOGY, (ORAL, ANAL, URETHRAL) ANCILLARY ONLY   Will notify of any positive results. Instructed to refrain from sexual activity for at least seven days.  Reviewed expectations re: course of current medical issues. Questions answered. Outlined signs and symptoms indicating need for more acute intervention. Patient verbalized understanding. After Visit Summary given.   SUBJECTIVE:  Malik Sanders is a 27 y.o. male who desires STI screening. No symptoms. Pt states he is here for STI testing due to changing partners. He denies any sx and doe not want the blood work.   OBJECTIVE:  Vitals:   08/24/23 1329  BP: (!) 105/59  Pulse: 63  Resp: 16  Temp: 98.3 F (36.8 C)  TempSrc: Oral  SpO2: 98%    General appearance: alert, cooperative, appears stated age and no distress GU: deferred Skin: warm and dry Psychological: alert and cooperative; normal mood and affect.    Labs Reviewed  CYTOLOGY, (ORAL, ANAL, URETHRAL) ANCILLARY ONLY    No Known Allergies  History reviewed. No pertinent past medical history. Family History  Problem Relation Age of Onset   Healthy Mother    Healthy Father    Social History   Socioeconomic History   Marital status: Single    Spouse name: Not on file   Number of children: Not on file   Years of education: Not on file   Highest education level: Not on file  Occupational History   Not on file  Tobacco Use   Smoking status: Some Days    Types: Cigars   Smokeless  tobacco: Never  Vaping Use   Vaping status: Never Used  Substance and Sexual Activity   Alcohol use: Yes    Comment: rarely   Drug use: Yes    Types: Marijuana   Sexual activity: Yes    Birth control/protection: Condom    Comment: sometimes  Other Topics Concern   Not on file  Social History Narrative   Not on file   Social Determinants of Health   Financial Resource Strain: Not on file  Food Insecurity: Not on file  Transportation Needs: Not on file  Physical Activity: Not on file  Stress: Not on file  Social Connections: Unknown (02/24/2022)   Received from [facility]   Social Network    Social Network: Not on file  Intimate Partner Violence: Unknown (01/16/2022)   Received from [facility], [facility]   HITS    Physically Hurt: Not on file    Insult or Talk Down To: Not on file    Threaten Physical Harm: Not on file    Scream or Curse: Not on file           Mardella Layman, MD 08/24/23 1344

## 2023-08-24 NOTE — ED Triage Notes (Signed)
Pt states he is here for STI testing due to changing partners. He denies any sx and doe not want the blood work.

## 2023-08-25 LAB — CYTOLOGY, (ORAL, ANAL, URETHRAL) ANCILLARY ONLY
Chlamydia: NEGATIVE
Comment: NEGATIVE
Comment: NEGATIVE
Comment: NORMAL
Neisseria Gonorrhea: NEGATIVE
Trichomonas: NEGATIVE

## 2023-09-05 ENCOUNTER — Ambulatory Visit (HOSPITAL_COMMUNITY)
Admission: RE | Admit: 2023-09-05 | Discharge: 2023-09-05 | Disposition: A | Discharge status: Home / Self Care | Payer: Medicaid Other | Source: Ambulatory Visit | Attending: Emergency Medicine | Admitting: Emergency Medicine

## 2023-09-05 ENCOUNTER — Encounter (HOSPITAL_COMMUNITY): Payer: Self-pay

## 2023-09-05 VITALS — BP 116/69 | HR 57 | Temp 98.6°F | Resp 16 | Ht 73.0 in | Wt 138.0 lb

## 2023-09-05 DIAGNOSIS — R369 Urethral discharge, unspecified: Secondary | ICD-10-CM | POA: Diagnosis not present

## 2023-09-05 LAB — HIV ANTIBODY (ROUTINE TESTING W REFLEX): HIV Screen 4th Generation wRfx: NONREACTIVE

## 2023-09-05 NOTE — Discharge Instructions (Addendum)
Today you have been tested for sexually transmitted diseases.  Results will be available over the next few days on MyChart and our staff will contact you if anything results is abnormal.  Ensure you are drinking plenty of water to help flush out your kidneys and urethra.  Abstain from intercourse until all results have been received.  If your penile discharge continues please follow-up with a primary care provider for further evaluation.  Return to clinic for any new or urgent symptoms.

## 2023-09-05 NOTE — ED Provider Notes (Signed)
[facility]    CSN: [id number] Arrival date & time: 09/05/23  1041      History   Chief Complaint Chief Complaint  Patient presents with   Penile Discharge    Got tested last week and said I was negative but I see discharge - Entered by patient    HPI Malik Sanders is a 27 y.o. male.   Patient presents to clinic complaining of a clear penile discharge that started 2 days ago.  He was recently sexually active with a new partner, reports he had worn a condom.  He had gotten tested at this clinic 12 days ago and the results were negative, he was not having symptoms at that time.  Denies any dysuria.  No penile sores or lesions.  He would like HIV and syphilis screening.  The history is provided by the patient and medical records.  Penile Discharge    History reviewed. No pertinent past medical history.  There are no problems to display for this patient.   History reviewed. No pertinent surgical history.     Home Medications    Prior to Admission medications   Medication Sig Start Date End Date Taking? Authorizing Provider  Cetirizine HCl 10 MG CAPS Take 1 capsule (10 mg total) by mouth daily for 14 days. 07/24/19 08/24/23  Wieters, Hallie C, PA-C  doxycycline (VIBRAMYCIN) 100 MG capsule Take 1 capsule (100 mg total) by mouth 2 (two) times daily. 05/28/22   Piontek, Denny Peon, MD  fluticasone (FLONASE) 50 MCG/ACT nasal spray Place 1-2 sprays into both nostrils daily for 7 days. 07/24/19 07/31/19  Wieters, Hallie C, PA-C  ibuprofen (ADVIL) 600 MG tablet Take 1 tablet (600 mg total) by mouth every 6 (six) hours as needed. 07/24/19   Wieters, Hallie C, PA-C  lidocaine (LIDODERM) 5 % Place 1 patch onto the skin daily. Remove & Discard patch within 12 hours or as directed by MD 11/03/21   Lorre Nick, MD  methocarbamol (ROBAXIN) 500 MG tablet Take 1 tablet (500 mg total) by mouth 2 (two) times daily. 02/11/21   Raspet, Noberto Retort, PA-C  naproxen (NAPROSYN) 500 MG tablet Take  1 tablet (500 mg total) by mouth 2 (two) times daily. 02/11/21   Raspet, Noberto Retort, PA-C    Family History Family History  Problem Relation Age of Onset   Healthy Mother    Healthy Father     Social History Social History   Tobacco Use   Smoking status: Some Days    Types: Cigars   Smokeless tobacco: Never  Vaping Use   Vaping status: Never Used  Substance Use Topics   Alcohol use: Yes    Comment: rarely   Drug use: Yes    Types: Marijuana     Allergies   Patient has no known allergies.   Review of Systems Review of Systems  Per HPI   Physical Exam Triage Vital Signs ED Triage Vitals [09/05/23 1101]  Encounter Vitals Group     BP 116/69     Systolic BP Percentile      Diastolic BP Percentile      Pulse Rate (!) 57     Resp 16     Temp 98.6 F (37 C)     Temp Source Oral     SpO2 97 %     Weight 138 lb (62.6 kg)     Height 6\' 1"  (1.854 m)     Head Circumference      Peak Flow  Pain Score 0     Pain Loc      Pain Education      Exclude from Growth Chart    No data found.  Updated Vital Signs BP 116/69 (BP Location: Right Arm)   Pulse (!) 57   Temp 98.6 F (37 C) (Oral)   Resp 16   Ht 6\' 1"  (1.854 m)   Wt 138 lb (62.6 kg)   SpO2 97%   BMI 18.21 kg/m   Visual Acuity Right Eye Distance:   Left Eye Distance:   Bilateral Distance:    Right Eye Near:   Left Eye Near:    Bilateral Near:     Physical Exam Vitals and nursing note reviewed.  Constitutional:      Appearance: Normal appearance.  HENT:     Head: Normocephalic and atraumatic.     Right Ear: External ear normal.     Left Ear: External ear normal.     Nose: Nose normal.     Mouth/Throat:     Mouth: Mucous membranes are moist.  Eyes:     Conjunctiva/sclera: Conjunctivae normal.  Cardiovascular:     Rate and Rhythm: Normal rate.  Pulmonary:     Effort: Pulmonary effort is normal. No respiratory distress.  Musculoskeletal:        General: Normal range of motion.   Neurological:     General: No focal deficit present.     Mental Status: He is alert.  Psychiatric:        Mood and Affect: Mood normal.      UC Treatments / Results  Labs (all labs ordered are listed, but only abnormal results are displayed) Labs Reviewed  RPR  HIV ANTIBODY (ROUTINE TESTING W REFLEX)  CYTOLOGY, (ORAL, ANAL, URETHRAL) ANCILLARY ONLY    EKG   Radiology No results found.  Procedures Procedures (including critical care time)  Medications Ordered in UC Medications - No data to display  Initial Impression / Assessment and Plan / UC Course  I have reviewed the triage vital signs and the nursing notes.  Pertinent labs & imaging results that were available during my care of the patient were reviewed by me and considered in my medical decision making (see chart for details).  Vitals and triage reviewed, patient is hemodynamically stable.  Clear penile discharge that started 2 days ago, cytology swab obtained.  HIV and syphilis screening obtained as well.  No dysuria, urinalysis deferred.  Staff will contact if results are positive to initiate appropriate treatment.  Work note provided.  Plan of care, follow-up care return precautions given, no questions at this time.    Final Clinical Impressions(s) / UC Diagnoses   Final diagnoses:  Penile discharge     Discharge Instructions      Today you have been tested for sexually transmitted diseases.  Results will be available over the next few days on MyChart and our staff will contact you if anything results is abnormal.  Ensure you are drinking plenty of water to help flush out your kidneys and urethra.  Abstain from intercourse until all results have been received.  If your penile discharge continues please follow-up with a primary care provider for further evaluation.  Return to clinic for any new or urgent symptoms.    ED Prescriptions   None    PDMP not reviewed this encounter.   Meshelle Holness, [place] N,  [place] 09/05/23 1113

## 2023-09-05 NOTE — ED Triage Notes (Signed)
Patient here today with c/o penile discharge X 2 days. Patient was tested for STDs 12 days ago and was negative.

## 2023-09-06 LAB — CYTOLOGY, (ORAL, ANAL, URETHRAL) ANCILLARY ONLY
Chlamydia: NEGATIVE
Comment: NEGATIVE
Comment: NEGATIVE
Comment: NORMAL
Neisseria Gonorrhea: NEGATIVE
Trichomonas: NEGATIVE

## 2023-09-06 LAB — RPR: RPR Ser Ql: NONREACTIVE

## 2024-09-04 ENCOUNTER — Encounter (HOSPITAL_COMMUNITY): Payer: Self-pay

## 2024-09-04 ENCOUNTER — Ambulatory Visit (HOSPITAL_COMMUNITY)
Admission: EM | Admit: 2024-09-04 | Discharge: 2024-09-04 | Disposition: A | Discharge status: Home / Self Care | Payer: Self-pay

## 2024-09-04 ENCOUNTER — Ambulatory Visit (INDEPENDENT_AMBULATORY_CARE_PROVIDER_SITE_OTHER): Payer: Self-pay

## 2024-09-04 DIAGNOSIS — M7918 Myalgia, other site: Secondary | ICD-10-CM

## 2024-09-04 DIAGNOSIS — R0789 Other chest pain: Secondary | ICD-10-CM

## 2024-09-04 DIAGNOSIS — M25511 Pain in right shoulder: Secondary | ICD-10-CM

## 2024-09-04 MED ORDER — NAPROXEN 500 MG PO TABS: 500.0000 mg | ORAL_TABLET | Freq: Two times a day (BID) | ORAL | 0 refills | Status: AC
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
## 2024-09-25 ENCOUNTER — Other Ambulatory Visit: Payer: Self-pay

## 2024-09-25 ENCOUNTER — Encounter (HOSPITAL_COMMUNITY): Payer: Self-pay | Admitting: *Deleted

## 2024-09-25 ENCOUNTER — Ambulatory Visit (HOSPITAL_COMMUNITY)
Admission: EM | Admit: 2024-09-25 | Discharge: 2024-09-25 | Disposition: A | Discharge status: Home / Self Care | Payer: Self-pay | Source: Home / Self Care

## 2024-09-25 DIAGNOSIS — J069 Acute upper respiratory infection, unspecified: Secondary | ICD-10-CM

## 2024-09-25 DIAGNOSIS — R040 Epistaxis: Secondary | ICD-10-CM

## 2024-09-25 DIAGNOSIS — B9789 Other viral agents as the cause of diseases classified elsewhere: Secondary | ICD-10-CM | POA: Insufficient documentation

## 2024-09-25 LAB — CBC
HCT: 44.8 % (ref 39.0–52.0)
Hemoglobin: 15 g/dL (ref 13.0–17.0)
MCH: 30.8 pg (ref 26.0–34.0)
MCHC: 33.5 g/dL (ref 30.0–36.0)
MCV: 92 fL (ref 80.0–100.0)
Platelets: 191 K/uL (ref 150–400)
RBC: 4.87 MIL/uL (ref 4.22–5.81)
RDW: 11.3 % — ABNORMAL LOW (ref 11.5–15.5)
WBC: 8.8 K/uL (ref 4.0–10.5)
nRBC: 0 % (ref 0.0–0.2)

## 2024-09-25 LAB — BASIC METABOLIC PANEL WITH GFR
Anion gap: 10 (ref 5–15)
BUN: 9 mg/dL (ref 6–20)
CO2: 30 mmol/L (ref 22–32)
Calcium: 8.2 mg/dL — ABNORMAL LOW (ref 8.9–10.3)
Chloride: 92 mmol/L — ABNORMAL LOW (ref 98–111)
Creatinine, Ser: 1.05 mg/dL (ref 0.61–1.24)
GFR, Estimated: >60 mL/min (ref >60)
Glucose, Bld: 81 mg/dL (ref 70–99)
Potassium: 3.8 mmol/L (ref 3.5–5.1)
Sodium: 132 mmol/L — ABNORMAL LOW (ref 135–145)

## 2024-09-25 LAB — POCT INFLUENZA A/B
Influenza A, POC: NEGATIVE
Influenza B, POC: NEGATIVE

## 2024-09-25 MED ORDER — OXYMETAZOLINE HCL 0.05 % NA SOLN
1.0000 | Freq: Once | NASAL | Status: AC
  Administered 2024-09-25: 19:00:00 1 via NASAL

## 2024-09-25 MED ORDER — SILVER NITRATE-POT NITRATE 75-25 % EX MISC
CUTANEOUS | Status: AC
  Filled 2024-09-25: qty 10

## 2024-09-25 MED ORDER — OXYMETAZOLINE HCL 0.05 % NA SOLN: 1.0000 | Freq: Two times a day (BID) | NASAL | Status: DC

## 2024-09-25 MED ORDER — OXYMETAZOLINE HCL 0.05 % NA SOLN
NASAL | Status: AC
  Filled 2024-09-25: qty 30

## 2024-09-25 NOTE — ED Provider Notes (Signed)
 [facility]    CSN: [id number] Arrival date & time: 09/25/24  1710      History   Chief Complaint Chief Complaint  Patient presents with   Epistaxis    HPI Rowen Kleinman is a 28 y.o. male.   Merek Giza is a 28 y.o. male presenting for chief complaint of cough, nasal congestion, nose bleeds, generalized fatigue, and fever/chills that started 2 days ago on September 23, 2024. Cough is sometimes productive and sometimes dry.  He has had frequent nosebleeds from the left nare for the last 24 to 48 hours.  States he has seeing clots coming out of his nose.  Denies history of bleeding/clotting disorder and recent head injuries.  He has felt lightheaded and dizzy with position changes for the last 24 to 48 hours but attributes this to his lack of appetite while being sick.  He is not currently experiencing nosebleed.  He does not take blood thinners. Denies recent sick contacts with similar symptoms. No shortness of breath, chest pain, palpitations, nausea, vomiting. Using over-the-counter medications to help with symptoms prior to arrival.   Epistaxis   History reviewed. No pertinent past medical history.  There are no active problems to display for this patient.   History reviewed. No pertinent surgical history.     Home Medications    Prior to Admission medications  Medication Sig Start Date End Date Taking? Authorizing Provider  NAPROXEN  PO Take by mouth.   Yes [provider]    Family History Family History  Problem Relation Age of Onset   Healthy Mother    Healthy Father     Social History Social History[1]   Allergies   Patient has no known allergies.   Review of Systems Review of Systems  HENT:  Positive for nosebleeds.   Per HPI   Physical Exam Triage Vital Signs ED Triage Vitals  Encounter Vitals Group     BP 09/25/24 1752 134/81     Girls Systolic BP Percentile --      Girls Diastolic BP Percentile --      Boys Systolic  BP Percentile --      Boys Diastolic BP Percentile --      Pulse Rate 09/25/24 1752 79     Resp 09/25/24 1752 18     Temp 09/25/24 1752 99.9 F (37.7 C)     Temp Source 09/25/24 1752 Oral     SpO2 09/25/24 1752 97 %     Weight --      Height --      Head Circumference --      Peak Flow --      Pain Score 09/25/24 1753 0     Pain Loc --      Pain Education --      Exclude from Growth Chart --    No data found.  Updated Vital Signs BP 134/81   Pulse 79   Temp 99.9 F (37.7 C) (Oral)   Resp 18   SpO2 97%   Visual Acuity Right Eye Distance:   Left Eye Distance:   Bilateral Distance:    Right Eye Near:   Left Eye Near:    Bilateral Near:     Physical Exam Vitals and nursing note reviewed.  Constitutional:      Appearance: He is not ill-appearing or toxic-appearing.  HENT:     Head: Normocephalic and atraumatic.     Right Ear: Hearing, tympanic membrane, ear canal and external ear  normal.     Left Ear: Hearing, tympanic membrane, ear canal and external ear normal.     Nose: Congestion present.     Right Nostril: No foreign body, epistaxis, septal hematoma or occlusion.     Left Nostril: Epistaxis present. No foreign body, septal hematoma or occlusion.     Right Turbinates: Swollen.     Left Turbinates: Swollen.     Comments: Active brisk bleeding from the left nare after blowing his nose and after examination.  Initially it appeared that his nose was bleeding from the [place] box plexus.  Cautery was attempted to the anterior region/[place] box plexus unsuccessfully. Pressure held to the left nare for 10 minutes, 2 sprays of Afrin given, patient was able to blow his nose and 2-3 dime sized clots were expelled but this resulted in resolution of epistaxis.      Mouth/Throat:     Lips: Pink.     Mouth: Mucous membranes are moist. No injury or oral lesions.     Dentition: Normal dentition.     Tongue: No lesions.     Pharynx: Oropharynx is clear. Uvula midline. No  pharyngeal swelling, oropharyngeal exudate, posterior oropharyngeal erythema, uvula swelling or postnasal drip.     Tonsils: No tonsillar exudate.  Eyes:     General: Lids are normal. Vision grossly intact. Gaze aligned appropriately.     Extraocular Movements: Extraocular movements intact.     Conjunctiva/sclera: Conjunctivae normal.  Neck:     Trachea: Trachea and phonation normal.  Cardiovascular:     Rate and Rhythm: Normal rate and regular rhythm.     Heart sounds: Normal heart sounds, S1 normal and S2 normal.  Pulmonary:     Effort: Pulmonary effort is normal. No respiratory distress.     Breath sounds: Normal breath sounds and air entry.  Musculoskeletal:     Cervical back: Neck supple.  Lymphadenopathy:     Cervical: No cervical adenopathy.  Skin:    General: Skin is warm and dry.     Capillary Refill: Capillary refill takes less than 2 seconds.     Findings: No rash.  Neurological:     General: No focal deficit present.     Mental Status: He is alert and oriented to person, place, and time. Mental status is at baseline.     Cranial Nerves: No dysarthria or facial asymmetry.  Psychiatric:        Mood and Affect: Mood normal.        Speech: Speech normal.        Behavior: Behavior normal.        Thought Content: Thought content normal.        Judgment: Judgment normal.      UC Treatments / Results  Labs (all labs ordered are listed, but only abnormal results are displayed) Labs Reviewed  CBC  BASIC METABOLIC PANEL WITH GFR  POCT INFLUENZA A/B    EKG   Radiology No results found.  Procedures Procedures (including critical care time)  Medications Ordered in UC Medications  oxymetazoline  (AFRIN) 0.05 % nasal spray 1 spray (1 spray Each Nare Given 09/25/24 1845)    Initial Impression / Assessment and Plan / UC Course  I have reviewed the triage vital signs and the nursing notes.  Pertinent labs & imaging results that were available during my care of  the patient were reviewed by me and considered in my medical decision making (see chart for details).   1.  Viral URI  with cough Suspect viral URI, viral syndrome.  Strep/viral testing: POC influenza A+B testing negative, patient did an at home COVID test today and it was negative.   Physical exam findings reassuring, vital signs hemodynamically stable, and lungs clear, therefore deferred imaging of the chest.  Advised supportive care/prescriptions for symptomatic relief as outlined in AVS.    2. Epistaxis Nose bleed likely posterior.  Attempted to sprays of Afrin and cautery of the bleeding near the [place] box plexus with unsuccessful relief of epistaxis. 10 to 15 minutes of manual pressure held by patient.  On recheck, patient had blown his nose and expelled 2-3 dime size clots into the tissue, this completely resolved and stopped the epistaxis from the left nare. Advised to use Afrin every 12 hours as needed for up to 3 days in addition to nasal saline sprays. CBC and BMP are pending to evaluate further causes of lightheadedness, staff will call if blood work is abnormal in the next 12 to 24 hours.  ER precautions discussed.  Ambulatory referral to ear nose and throat ordered for follow-up should epistaxis persist.   Counseled patient on potential for adverse effects with medications prescribed/recommended today, strict ER and return-to-clinic precautions discussed, patient verbalized understanding.    Final Clinical Impressions(s) / UC Diagnoses   Final diagnoses:  Epistaxis  Viral URI with cough     Discharge Instructions      You have a viral illness which will improve on its own with rest, fluids, and medications to help with your symptoms.  Tylenol , guaifenesin (plain mucinex), and saline nasal sprays may help relieve symptoms.   Two teaspoons of honey in 1 cup of warm water every 4-6 hours may help with throat pains.  Humidifier in room at nighttime may help soothe cough  (clean well daily).   Use saline nasal sprays to the nose as needed for nasal congestion. You may use 2 puffs of Afrin nasal spray every 12 hours for up to 3 days.  Do not use this medication longer than 3 days as this can cause rebound/worsening nasal congestion. I have sent in a referral to an ear nose and throat specialist for you to follow-up with regarding your nosebleeds. If you have not heard from the ear nose and throat specialist in the next 2-3 business days, give them a phone call at the phone number listed below to set up an appointment.  If your nose bleeds again while you are at home, please hold firm pressure to the nose for 10 minutes to stop the bleeding. Use Afrin nasal spray. If your nose continues to bleed despite holding pressure for 10 minutes, please seek medical care at either the urgent care or the emergency room.  Labs will come back in 2 to 3 days, staff will call you if abnormal.  For chest pain, shortness of breath, inability to keep food or fluids down without vomiting, fever that does not respond to tylenol  or motrin , or any other severe symptoms, please go to the ER for further evaluation. Return to urgent care as needed, otherwise follow-up with PCP.       ED Prescriptions   None    PDMP not reviewed this encounter.      [1]  Social History Tobacco Use   Smoking status: Some Days    Types: Cigars   Smokeless tobacco: Never  Vaping Use   Vaping status: Never Used  Substance Use Topics   Alcohol use: Yes    Comment: occasional  Drug use: Yes    Types: Marijuana     Enedelia Dorna HERO, [place] 09/25/24 1929

## 2024-09-25 NOTE — ED Triage Notes (Addendum)
 States has been having a lot of fatigue, poor appetite, body aches, nasal congestion, cough onset 3 days ago. Denies vomiting, diarrhea, or fevers. Started with episodes of epistaxis from left nari 2 days ago; states sometimes epistaxis occurs after blowing his nose, and sometimes it's spontaneous or when he bends forward. States had negative home Covid test 2 days ago. Requesting test for influenza. 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

## 2024-09-25 NOTE — Discharge Instructions (Addendum)
 You have a viral illness which will improve on its own with rest, fluids, and medications to help with your symptoms.  Tylenol , guaifenesin (plain mucinex), and saline nasal sprays may help relieve symptoms.   Two teaspoons of honey in 1 cup of warm water every 4-6 hours may help with throat pains.  Humidifier in room at nighttime may help soothe cough (clean well daily).   Use saline nasal sprays to the nose as needed for nasal congestion. You may use 2 puffs of Afrin nasal spray every 12 hours for up to 3 days.  Do not use this medication longer than 3 days as this can cause rebound/worsening nasal congestion. I have sent in a referral to an ear nose and throat specialist for you to follow-up with regarding your nosebleeds. If you have not heard from the ear nose and throat specialist in the next 2-3 business days, give them a phone call at the phone number listed below to set up an appointment.  If your nose bleeds again while you are at home, please hold firm pressure to the nose for 10 minutes to stop the bleeding. Use Afrin nasal spray. If your nose continues to bleed despite holding pressure for 10 minutes, please seek medical care at either the urgent care or the emergency room.  Labs will come back in 2 to 3 days, staff will call you if abnormal.  For chest pain, shortness of breath, inability to keep food or fluids down without vomiting, fever that does not respond to tylenol  or motrin , or any other severe symptoms, please go to the ER for further evaluation. Return to urgent care as needed, otherwise follow-up with PCP.

## 2024-09-26 ENCOUNTER — Ambulatory Visit (HOSPITAL_COMMUNITY): Payer: Self-pay

## 2024-09-26 ENCOUNTER — Encounter (INDEPENDENT_AMBULATORY_CARE_PROVIDER_SITE_OTHER): Payer: Self-pay

## 2024-10-30 ENCOUNTER — Institutional Professional Consult (permissible substitution) (INDEPENDENT_AMBULATORY_CARE_PROVIDER_SITE_OTHER): Payer: Self-pay | Admitting: Otolaryngology

## 2024-10-30 NOTE — Progress Notes (Unsigned)
 Dear Dr. Enedelia, Here is my assessment for our mutual patient, Malik Sanders. Thank you for allowing me the opportunity to care for your patient. Please do not hesitate to contact me should you have any other questions. Sincerely, Dr. Eldora Blanch  Otolaryngology Clinic Note  HISTORY: Malik Sanders is a 29 y.o. male with history of *** kindly referred by Dr. Enedelia for evaluation of ***.   He reports ***.  Current symptoms include ***.  He denies ***.  Symptoms began *** ago.  Symptom severity is ***.  Improvement occurred with ***.  Additional evaluation has included ***.  Allergy testing *** been done.  *** No previous sinonasal surgery.  He is currently using ***.  GLP-1: *** AP/AC: ***  Tobacco: *** Lives in ***  PMHx: ***  RADIOGRAPHIC EVALUATION AND INDEPENDENT REVIEW OF OTHER RECORDS:: Dorna Enedelia, NP (09/25/2024): nose bleeds, generalized fatigue; frequent left epistaxis with clots coming out; ; left brisk bleeding, pressure held and Afrin. Dx: Epistaxis; Rx: ref to ENT CBC and BMP (09/25/2024): BUN/Cr 9/1.05; WBC 8.8, Hgb 15 CT Head 06/06/2014 independently interpreted with respect to sinuses: no significant paranasal sinus disease or masses appreciated; study limited due to thick cuts  No past medical history on file. No past surgical history on file. Family History  Problem Relation Age of Onset   Healthy Mother    Healthy Father    Social History   Tobacco Use   Smoking status: Some Days    Types: Cigars   Smokeless tobacco: Never  Substance Use Topics   Alcohol use: Yes    Comment: occasional   Allergies[1] Current Outpatient Medications  Medication Sig Dispense Refill   NAPROXEN  PO Take by mouth.     No current facility-administered medications for this visit.   There were no vitals taken for this visit.  PHYSICAL EXAM:  There were no vitals taken for this visit.   Salient findings:  CN II-XII intact *** Bilateral EAC clear and TM intact  with well pneumatized middle ear spaces [place] 512: [place]in[place] 512: AC > BC b/l *** R[place] 1024: AC > BC b/l *** Nose: Anterior rhinoscopy reveals ***.  Nasal endoscopy was indicated to better evaluate the nose and paranasal sinuses, given the patient's history and exam findings, and is detailed below. No lesions of oral cavity/oropharynx; dentition *** No obviously palpable neck masses/lymphadenopathy/thyromegaly No respiratory distress or stridor***   PROCEDURE:  Prior to initiating any procedures, risks/benefits/alternatives were explained to the patient and verbal consent obtained. Diagnostic Nasal Endoscopy Pre-procedure diagnosis: Concern for *** Post-procedure diagnosis: same Indication: See pre-procedure diagnosis and physical exam above Complications: None apparent EBL: *** mL Anesthesia: Lidocaine  4% and topical decongestant was topically sprayed in each nasal cavity  Description of Procedure:  Patient was identified. A rigid 30 degree endoscope was utilized to evaluate the sinonasal cavities, mucosa, sinus ostia and turbinates and septum.  Overall, signs of mucosal inflammation are*** noted.  Also noted are ***.  No mucopurulence, polyps, or masses noted.   Right Middle meatus: *** Right SE Recess: *** Left MM: *** Left SE Recess: *** Photodocumentation was obtained.  CPT CODE -- 31231 - Mod 25   ASSESSMENT:  ***  No diagnosis found.   PLAN: We've discussed issues and options today.  We reviewed the nasal endoscopy images together.  The risks, benefits and alternatives were discussed and questions answered.  He has elected to proceed with:  1) 2) Follow-up in *** -- sooner as necessary.  See below regarding  exact medications prescribed this encounter including dosages and route: No orders of the defined types were placed in this encounter.    Thank you for allowing me the opportunity to care for your patient. Please do not hesitate to contact me should you have any  other questions.  Sincerely, Eldora Blanch, MD Otolaryngologist (ENT), [facility] Phone: [phone] Fax: [phone]  MDM:  Level ***: 99*** Complexity/Problems addressed: *** Data complexity: *** independent interpretation of *** - Morbidity: ***  - Prescription Drug prescribed or managed: ***  10/30/2024, 12:56 PM      [1] No Known Allergies
